# Patient Record
Sex: Female | Born: 1965
Health system: Southern US, Community
[De-identification: ages and names within clinical notes are randomized; demographics above are authoritative.]

---

## 2002-11-22 ENCOUNTER — Encounter: Admission: RE | Admit: 2002-11-22 | Discharge: 2002-11-22 | Payer: Self-pay | Admitting: Family Medicine

## 2002-11-22 ENCOUNTER — Encounter: Payer: Self-pay | Admitting: Family Medicine

## 2003-12-22 ENCOUNTER — Other Ambulatory Visit: Admission: RE | Admit: 2003-12-22 | Discharge: 2003-12-22 | Payer: Self-pay | Admitting: Family Medicine

## 2005-01-05 ENCOUNTER — Other Ambulatory Visit: Admission: RE | Admit: 2005-01-05 | Discharge: 2005-01-05 | Payer: Self-pay | Admitting: Family Medicine

## 2005-03-29 ENCOUNTER — Encounter: Admission: RE | Admit: 2005-03-29 | Discharge: 2005-03-29 | Payer: Self-pay | Admitting: Family Medicine

## 2006-04-06 ENCOUNTER — Other Ambulatory Visit: Admission: RE | Admit: 2006-04-06 | Discharge: 2006-04-06 | Payer: Self-pay | Admitting: Family Medicine

## 2007-10-18 ENCOUNTER — Other Ambulatory Visit: Admission: RE | Admit: 2007-10-18 | Discharge: 2007-10-18 | Payer: Self-pay | Admitting: Family Medicine

## 2008-04-15 ENCOUNTER — Encounter: Admission: RE | Admit: 2008-04-15 | Discharge: 2008-04-15 | Payer: Self-pay | Admitting: Family Medicine

## 2008-10-07 ENCOUNTER — Encounter: Admission: RE | Admit: 2008-10-07 | Discharge: 2008-10-07 | Payer: Self-pay | Admitting: Family Medicine

## 2008-10-20 ENCOUNTER — Other Ambulatory Visit: Admission: RE | Admit: 2008-10-20 | Discharge: 2008-10-20 | Payer: Self-pay | Admitting: Family Medicine

## 2009-10-29 ENCOUNTER — Other Ambulatory Visit: Admission: RE | Admit: 2009-10-29 | Discharge: 2009-10-29 | Payer: Self-pay | Admitting: Family Medicine

## 2010-12-19 ENCOUNTER — Encounter: Payer: Self-pay | Admitting: Family Medicine

## 2010-12-20 ENCOUNTER — Encounter: Payer: Self-pay | Admitting: Family Medicine

## 2011-01-03 ENCOUNTER — Other Ambulatory Visit (HOSPITAL_COMMUNITY)
Admission: RE | Admit: 2011-01-03 | Discharge: 2011-01-03 | Disposition: A | Discharge status: Home / Self Care | Payer: 59 | Source: Ambulatory Visit | Attending: Family Medicine | Admitting: Family Medicine

## 2011-01-03 ENCOUNTER — Other Ambulatory Visit: Payer: Self-pay | Admitting: Family Medicine

## 2011-01-03 DIAGNOSIS — Z124 Encounter for screening for malignant neoplasm of cervix: Secondary | ICD-10-CM | POA: Insufficient documentation

## 2012-02-02 ENCOUNTER — Other Ambulatory Visit: Payer: Self-pay | Admitting: Family Medicine

## 2012-02-02 ENCOUNTER — Other Ambulatory Visit (HOSPITAL_COMMUNITY)
Admission: RE | Admit: 2012-02-02 | Discharge: 2012-02-02 | Disposition: A | Discharge status: Home / Self Care | Payer: 59 | Source: Ambulatory Visit | Attending: Family Medicine | Admitting: Family Medicine

## 2012-02-02 DIAGNOSIS — Z01419 Encounter for gynecological examination (general) (routine) without abnormal findings: Secondary | ICD-10-CM | POA: Insufficient documentation

## 2013-04-08 ENCOUNTER — Other Ambulatory Visit: Payer: Self-pay | Admitting: Family Medicine

## 2013-04-08 DIAGNOSIS — R920 Mammographic microcalcification found on diagnostic imaging of breast: Secondary | ICD-10-CM

## 2013-04-24 ENCOUNTER — Ambulatory Visit
Admission: RE | Admit: 2013-04-24 | Discharge: 2013-04-24 | Disposition: A | Discharge status: Home / Self Care | Payer: 59 | Source: Ambulatory Visit | Attending: Family Medicine | Admitting: Family Medicine

## 2013-04-24 ENCOUNTER — Other Ambulatory Visit: Payer: Self-pay | Admitting: Family Medicine

## 2013-04-24 DIAGNOSIS — R920 Mammographic microcalcification found on diagnostic imaging of breast: Secondary | ICD-10-CM

## 2013-04-30 ENCOUNTER — Other Ambulatory Visit: Payer: Self-pay | Admitting: Family Medicine

## 2013-04-30 ENCOUNTER — Other Ambulatory Visit (HOSPITAL_COMMUNITY): Payer: Self-pay | Admitting: Diagnostic Radiology

## 2013-04-30 ENCOUNTER — Ambulatory Visit
Admission: RE | Admit: 2013-04-30 | Discharge: 2013-04-30 | Disposition: A | Discharge status: Home / Self Care | Payer: 59 | Source: Ambulatory Visit | Attending: Family Medicine | Admitting: Family Medicine

## 2013-04-30 DIAGNOSIS — R920 Mammographic microcalcification found on diagnostic imaging of breast: Secondary | ICD-10-CM

## 2013-05-01 ENCOUNTER — Inpatient Hospital Stay: Admission: RE | Admit: 2013-05-01 | Payer: 59 | Source: Ambulatory Visit

## 2014-02-12 ENCOUNTER — Other Ambulatory Visit (HOSPITAL_COMMUNITY)
Admission: RE | Admit: 2014-02-12 | Discharge: 2014-02-12 | Disposition: A | Discharge status: Home / Self Care | Payer: 59 | Source: Ambulatory Visit | Attending: Family Medicine | Admitting: Family Medicine

## 2014-02-12 ENCOUNTER — Other Ambulatory Visit: Payer: Self-pay | Admitting: Family Medicine

## 2014-02-12 DIAGNOSIS — N6459 Other signs and symptoms in breast: Secondary | ICD-10-CM

## 2014-02-12 DIAGNOSIS — Z124 Encounter for screening for malignant neoplasm of cervix: Secondary | ICD-10-CM | POA: Insufficient documentation

## 2014-02-12 DIAGNOSIS — N6320 Unspecified lump in the left breast, unspecified quadrant: Principal | ICD-10-CM

## 2014-02-12 DIAGNOSIS — N6325 Unspecified lump in the left breast, overlapping quadrants: Secondary | ICD-10-CM

## 2014-02-21 ENCOUNTER — Other Ambulatory Visit: Payer: 59

## 2014-05-23 ENCOUNTER — Ambulatory Visit
Admission: RE | Admit: 2014-05-23 | Discharge: 2014-05-23 | Disposition: A | Discharge status: Home / Self Care | Payer: 59 | Source: Ambulatory Visit | Attending: Family Medicine | Admitting: Family Medicine

## 2014-05-23 ENCOUNTER — Encounter (INDEPENDENT_AMBULATORY_CARE_PROVIDER_SITE_OTHER): Payer: Self-pay

## 2014-05-23 DIAGNOSIS — N6459 Other signs and symptoms in breast: Secondary | ICD-10-CM

## 2016-02-22 ENCOUNTER — Other Ambulatory Visit: Payer: Self-pay | Admitting: Family Medicine

## 2016-02-22 ENCOUNTER — Other Ambulatory Visit (HOSPITAL_COMMUNITY)
Admission: RE | Admit: 2016-02-22 | Discharge: 2016-02-22 | Disposition: A | Discharge status: Home / Self Care | Payer: 59 | Source: Ambulatory Visit | Attending: Family Medicine | Admitting: Family Medicine

## 2016-02-22 DIAGNOSIS — Z124 Encounter for screening for malignant neoplasm of cervix: Secondary | ICD-10-CM | POA: Insufficient documentation

## 2016-02-23 LAB — CYTOLOGY - PAP

## 2016-07-13 ENCOUNTER — Other Ambulatory Visit: Payer: Self-pay | Admitting: Family Medicine

## 2016-07-13 DIAGNOSIS — Z1231 Encounter for screening mammogram for malignant neoplasm of breast: Secondary | ICD-10-CM

## 2016-07-27 ENCOUNTER — Ambulatory Visit
Admission: RE | Admit: 2016-07-27 | Discharge: 2016-07-27 | Disposition: A | Discharge status: Home / Self Care | Payer: 59 | Source: Ambulatory Visit | Attending: Family Medicine | Admitting: Family Medicine

## 2016-07-27 DIAGNOSIS — Z1231 Encounter for screening mammogram for malignant neoplasm of breast: Secondary | ICD-10-CM

## 2019-03-12 DIAGNOSIS — Z0000 Encounter for general adult medical examination without abnormal findings: Secondary | ICD-10-CM | POA: Diagnosis not present

## 2019-03-12 DIAGNOSIS — E039 Hypothyroidism, unspecified: Secondary | ICD-10-CM | POA: Diagnosis not present

## 2019-03-12 DIAGNOSIS — Z13220 Encounter for screening for lipoid disorders: Secondary | ICD-10-CM | POA: Diagnosis not present

## 2019-03-12 DIAGNOSIS — Z8342 Family history of familial hypercholesterolemia: Secondary | ICD-10-CM | POA: Diagnosis not present

## 2019-04-10 ENCOUNTER — Other Ambulatory Visit: Payer: Self-pay | Admitting: Family Medicine

## 2019-04-10 DIAGNOSIS — Z1231 Encounter for screening mammogram for malignant neoplasm of breast: Secondary | ICD-10-CM

## 2019-05-04 ENCOUNTER — Ambulatory Visit: Payer: 59

## 2019-06-04 ENCOUNTER — Ambulatory Visit
Admission: RE | Admit: 2019-06-04 | Discharge: 2019-06-04 | Disposition: A | Discharge status: Home / Self Care | Payer: BC Managed Care – PPO | Source: Ambulatory Visit | Attending: Family Medicine | Admitting: Family Medicine

## 2019-06-04 ENCOUNTER — Other Ambulatory Visit: Payer: Self-pay

## 2019-06-04 DIAGNOSIS — Z1231 Encounter for screening mammogram for malignant neoplasm of breast: Secondary | ICD-10-CM

## 2019-07-19 DIAGNOSIS — H5713 Ocular pain, bilateral: Secondary | ICD-10-CM | POA: Diagnosis not present

## 2019-10-31 ENCOUNTER — Other Ambulatory Visit: Payer: Self-pay

## 2019-10-31 DIAGNOSIS — Z20822 Contact with and (suspected) exposure to covid-19: Secondary | ICD-10-CM

## 2019-11-04 LAB — NOVEL CORONAVIRUS, NAA: SARS-CoV-2, NAA: NOT DETECTED

## 2019-11-25 DIAGNOSIS — H04123 Dry eye syndrome of bilateral lacrimal glands: Secondary | ICD-10-CM | POA: Diagnosis not present

## 2019-12-23 DIAGNOSIS — H04123 Dry eye syndrome of bilateral lacrimal glands: Secondary | ICD-10-CM | POA: Diagnosis not present

## 2020-03-10 DIAGNOSIS — H04123 Dry eye syndrome of bilateral lacrimal glands: Secondary | ICD-10-CM | POA: Diagnosis not present

## 2020-04-14 DIAGNOSIS — E039 Hypothyroidism, unspecified: Secondary | ICD-10-CM | POA: Diagnosis not present

## 2020-04-14 DIAGNOSIS — Z0000 Encounter for general adult medical examination without abnormal findings: Secondary | ICD-10-CM | POA: Diagnosis not present

## 2020-04-14 DIAGNOSIS — Z13220 Encounter for screening for lipoid disorders: Secondary | ICD-10-CM | POA: Diagnosis not present

## 2020-04-16 ENCOUNTER — Other Ambulatory Visit (HOSPITAL_COMMUNITY): Payer: Self-pay | Admitting: Family Medicine

## 2020-04-16 DIAGNOSIS — R0989 Other specified symptoms and signs involving the circulatory and respiratory systems: Secondary | ICD-10-CM

## 2020-04-24 ENCOUNTER — Ambulatory Visit (HOSPITAL_COMMUNITY)
Admission: RE | Admit: 2020-04-24 | Discharge: 2020-04-24 | Disposition: A | Discharge status: Home / Self Care | Payer: BC Managed Care – PPO | Source: Ambulatory Visit | Attending: Family Medicine | Admitting: Family Medicine

## 2020-04-24 ENCOUNTER — Other Ambulatory Visit: Payer: Self-pay

## 2020-04-24 DIAGNOSIS — R0989 Other specified symptoms and signs involving the circulatory and respiratory systems: Secondary | ICD-10-CM

## 2020-06-24 DIAGNOSIS — H04123 Dry eye syndrome of bilateral lacrimal glands: Secondary | ICD-10-CM | POA: Diagnosis not present

## 2020-06-24 DIAGNOSIS — H0288A Meibomian gland dysfunction right eye, upper and lower eyelids: Secondary | ICD-10-CM | POA: Diagnosis not present

## 2020-06-24 DIAGNOSIS — H0288B Meibomian gland dysfunction left eye, upper and lower eyelids: Secondary | ICD-10-CM | POA: Diagnosis not present

## 2020-07-23 DIAGNOSIS — H04123 Dry eye syndrome of bilateral lacrimal glands: Secondary | ICD-10-CM | POA: Diagnosis not present

## 2020-08-26 DIAGNOSIS — H40043 Steroid responder, bilateral: Secondary | ICD-10-CM | POA: Diagnosis not present

## 2020-08-26 DIAGNOSIS — H02881 Meibomian gland dysfunction right upper eyelid: Secondary | ICD-10-CM | POA: Diagnosis not present

## 2020-08-26 DIAGNOSIS — H02882 Meibomian gland dysfunction right lower eyelid: Secondary | ICD-10-CM | POA: Diagnosis not present

## 2020-08-26 DIAGNOSIS — L718 Other rosacea: Secondary | ICD-10-CM | POA: Diagnosis not present

## 2020-09-02 DIAGNOSIS — L719 Rosacea, unspecified: Secondary | ICD-10-CM | POA: Diagnosis not present

## 2020-09-02 DIAGNOSIS — H02883 Meibomian gland dysfunction of right eye, unspecified eyelid: Secondary | ICD-10-CM | POA: Diagnosis not present

## 2020-09-02 DIAGNOSIS — H02886 Meibomian gland dysfunction of left eye, unspecified eyelid: Secondary | ICD-10-CM | POA: Diagnosis not present

## 2020-09-02 DIAGNOSIS — H40043 Steroid responder, bilateral: Secondary | ICD-10-CM | POA: Diagnosis not present

## 2020-10-15 DIAGNOSIS — H40043 Steroid responder, bilateral: Secondary | ICD-10-CM | POA: Diagnosis not present

## 2020-10-15 DIAGNOSIS — H02886 Meibomian gland dysfunction of left eye, unspecified eyelid: Secondary | ICD-10-CM | POA: Diagnosis not present

## 2020-10-15 DIAGNOSIS — H02883 Meibomian gland dysfunction of right eye, unspecified eyelid: Secondary | ICD-10-CM | POA: Diagnosis not present

## 2020-10-15 DIAGNOSIS — L719 Rosacea, unspecified: Secondary | ICD-10-CM | POA: Diagnosis not present

## 2021-02-03 DIAGNOSIS — L718 Other rosacea: Secondary | ICD-10-CM | POA: Diagnosis not present

## 2021-02-03 DIAGNOSIS — H0288A Meibomian gland dysfunction right eye, upper and lower eyelids: Secondary | ICD-10-CM | POA: Diagnosis not present

## 2021-02-03 DIAGNOSIS — H0288B Meibomian gland dysfunction left eye, upper and lower eyelids: Secondary | ICD-10-CM | POA: Diagnosis not present

## 2021-02-03 DIAGNOSIS — H40043 Steroid responder, bilateral: Secondary | ICD-10-CM | POA: Diagnosis not present

## 2021-03-18 DIAGNOSIS — H6121 Impacted cerumen, right ear: Secondary | ICD-10-CM | POA: Diagnosis not present

## 2021-03-18 DIAGNOSIS — H93293 Other abnormal auditory perceptions, bilateral: Secondary | ICD-10-CM | POA: Diagnosis not present

## 2021-03-18 DIAGNOSIS — J343 Hypertrophy of nasal turbinates: Secondary | ICD-10-CM | POA: Diagnosis not present

## 2021-03-22 DIAGNOSIS — H93293 Other abnormal auditory perceptions, bilateral: Secondary | ICD-10-CM | POA: Diagnosis not present

## 2021-04-27 IMAGING — MG DIGITAL SCREENING BILATERAL MAMMOGRAM WITH TOMO AND CAD
8 series · 8 of 24 positions shown · non-contrast
Comparison: Previous exam(s).

CLINICAL DATA: Screening.

EXAM:
DIGITAL SCREENING BILATERAL MAMMOGRAM WITH TOMO AND CAD

[R CC synth-2D]
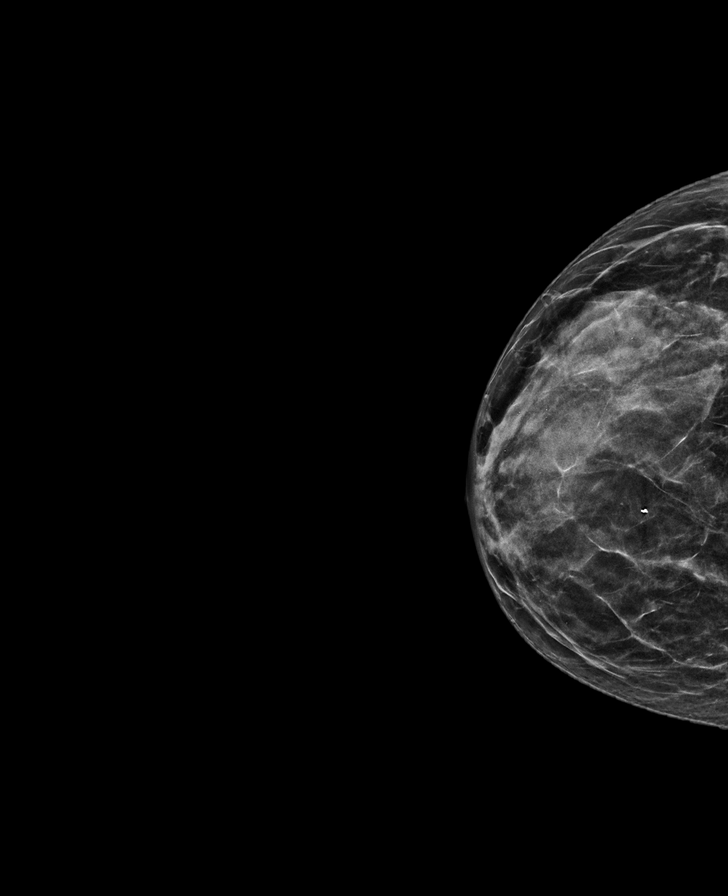

[L CC synth-2D]
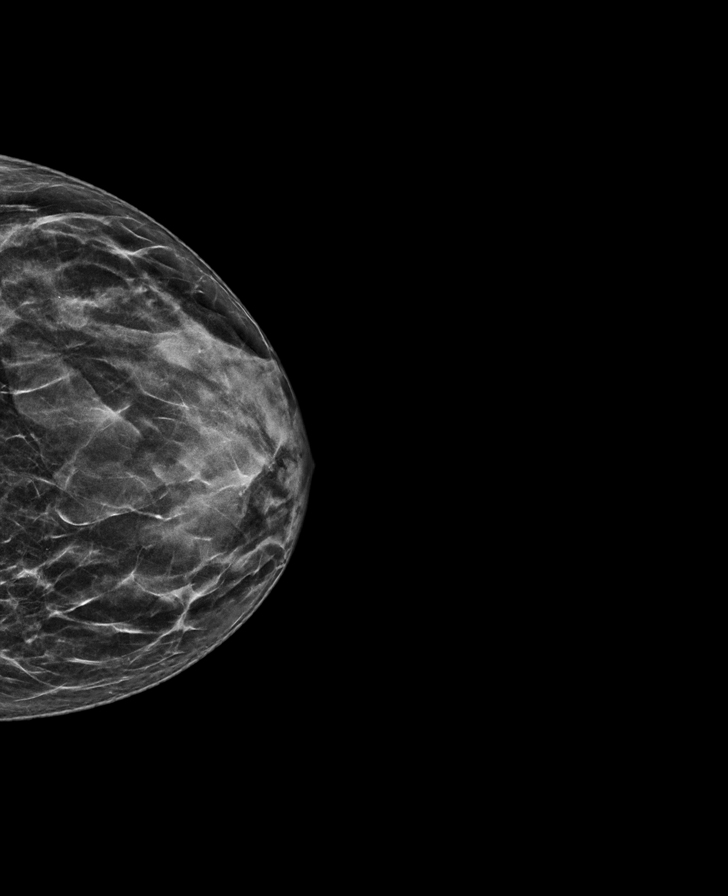

[L MLO synth-2D]
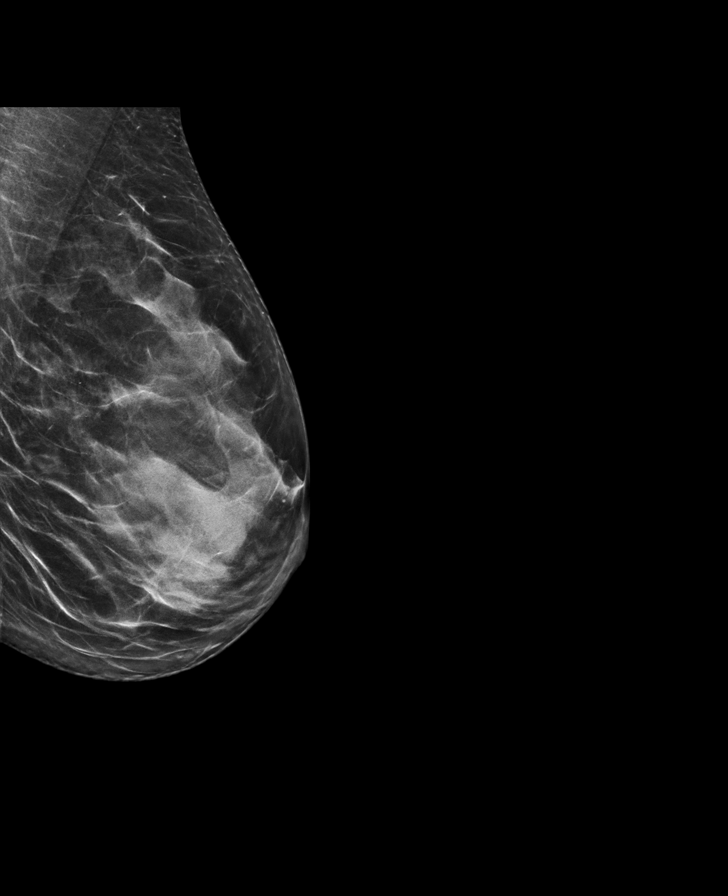

[R MLO synth-2D]
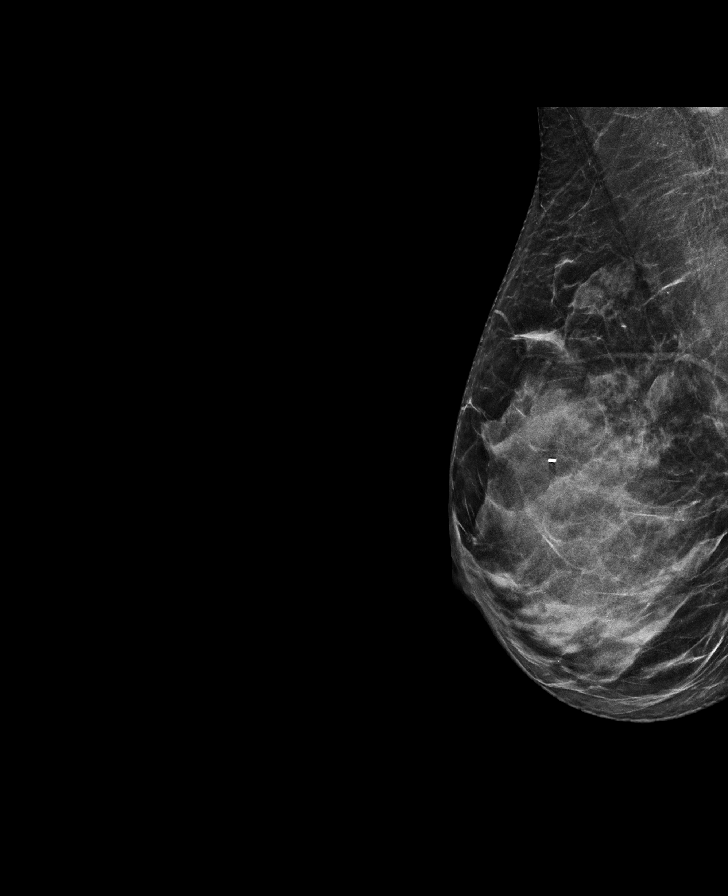

[L CC tomo · tomo slice 29/57.0]
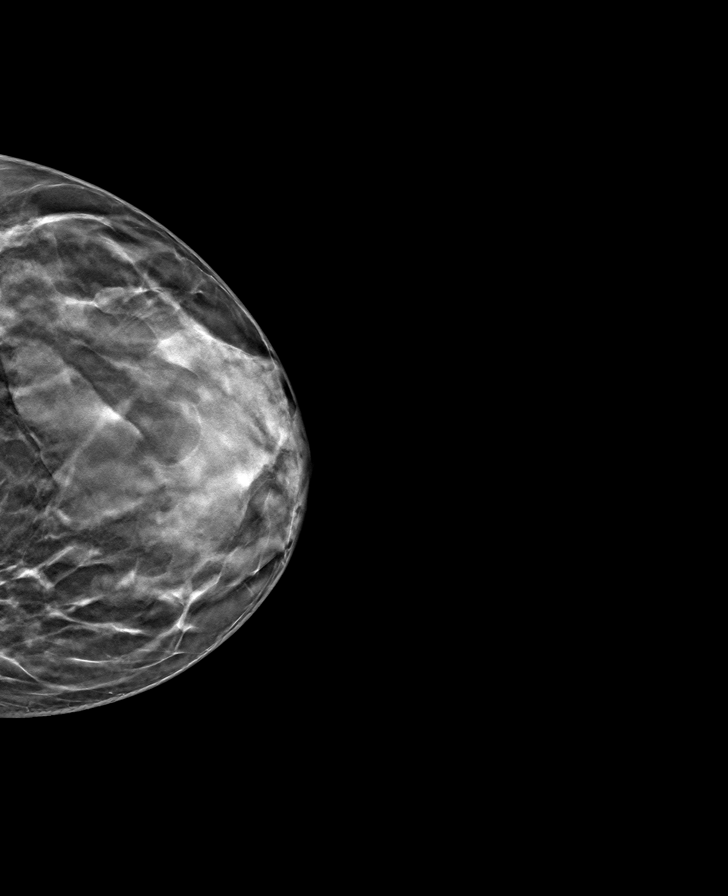

[R CC tomo · tomo slice 27/52.0]
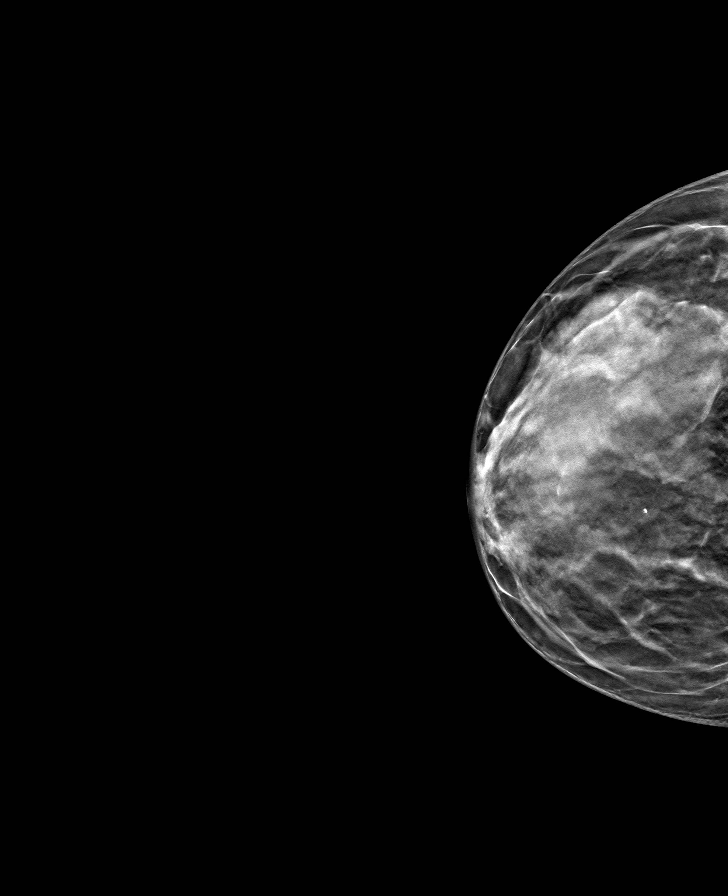

[L MLO tomo · tomo slice 33/65.0]
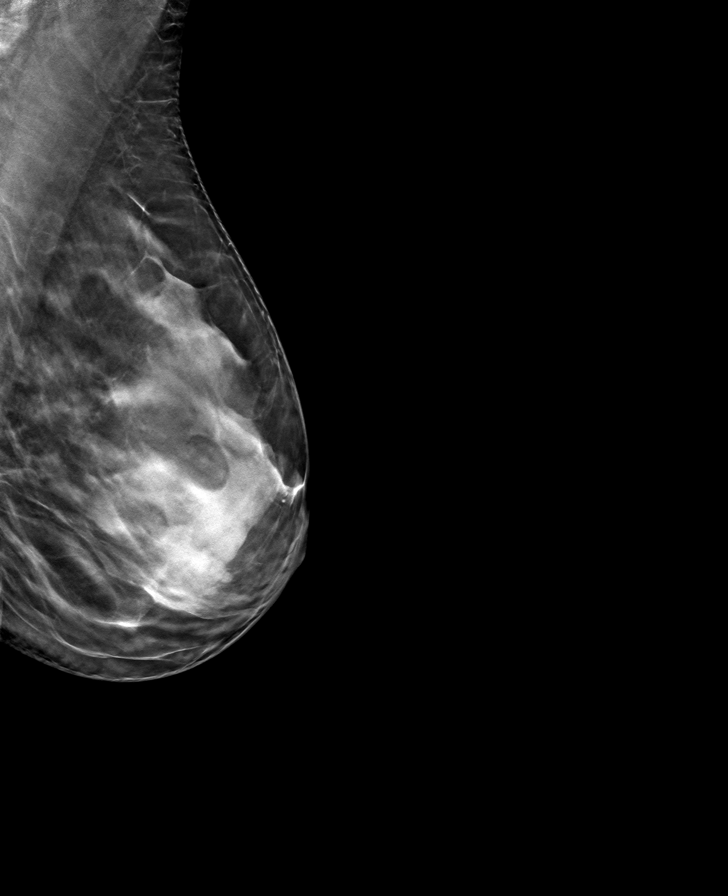

[R MLO tomo · tomo slice 31/61.0]
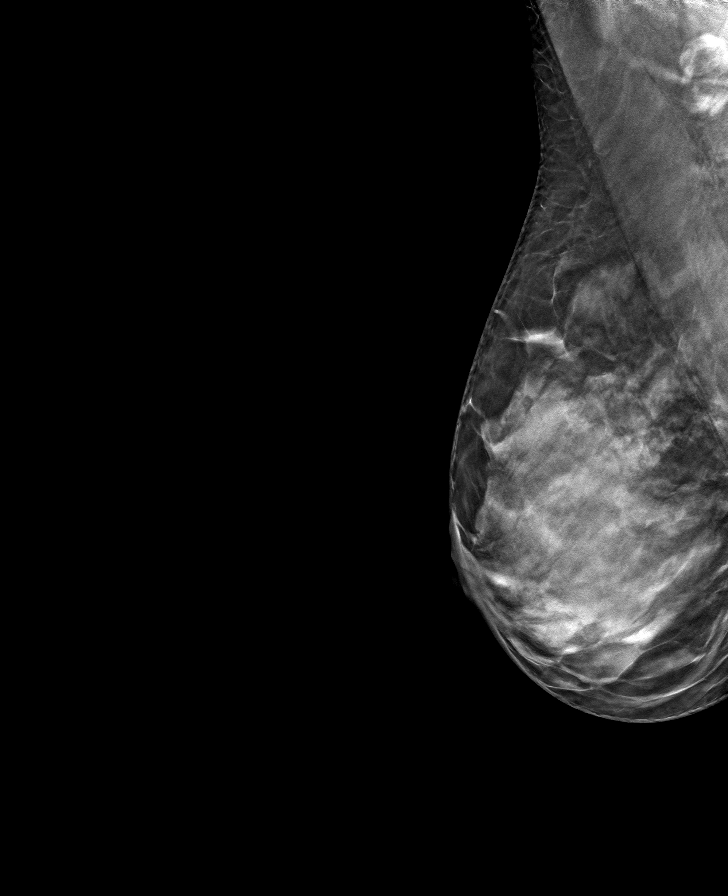

[8 of 24 positions shown; findings below may reference images not displayed]

ACR Breast Density Category d: The breast tissue is extremely dense,
which lowers the sensitivity of mammography
FINDINGS: There are no findings suspicious for malignancy. Images were
processed with CAD.
IMPRESSION: No mammographic evidence of malignancy. A result letter of this
screening mammogram will be mailed directly to the patient.

RECOMMENDATION:
Screening mammogram in one year. (Code:WO-0-ZI0)

BI-RADS CATEGORY  1: Negative.

## 2021-04-29 DIAGNOSIS — Z13220 Encounter for screening for lipoid disorders: Secondary | ICD-10-CM | POA: Diagnosis not present

## 2021-04-29 DIAGNOSIS — M9901 Segmental and somatic dysfunction of cervical region: Secondary | ICD-10-CM | POA: Diagnosis not present

## 2021-04-29 DIAGNOSIS — M5411 Radiculopathy, occipito-atlanto-axial region: Secondary | ICD-10-CM | POA: Diagnosis not present

## 2021-04-29 DIAGNOSIS — Z0000 Encounter for general adult medical examination without abnormal findings: Secondary | ICD-10-CM | POA: Diagnosis not present

## 2021-05-11 DIAGNOSIS — N951 Menopausal and female climacteric states: Secondary | ICD-10-CM | POA: Diagnosis not present

## 2021-05-18 DIAGNOSIS — L718 Other rosacea: Secondary | ICD-10-CM | POA: Diagnosis not present

## 2021-05-18 DIAGNOSIS — N951 Menopausal and female climacteric states: Secondary | ICD-10-CM | POA: Diagnosis not present

## 2021-05-18 DIAGNOSIS — H0102B Squamous blepharitis left eye, upper and lower eyelids: Secondary | ICD-10-CM | POA: Diagnosis not present

## 2021-05-18 DIAGNOSIS — N898 Other specified noninflammatory disorders of vagina: Secondary | ICD-10-CM | POA: Diagnosis not present

## 2021-05-18 DIAGNOSIS — R232 Flushing: Secondary | ICD-10-CM | POA: Diagnosis not present

## 2021-05-18 DIAGNOSIS — H02055 Trichiasis without entropian left lower eyelid: Secondary | ICD-10-CM | POA: Diagnosis not present

## 2021-05-18 DIAGNOSIS — H0102A Squamous blepharitis right eye, upper and lower eyelids: Secondary | ICD-10-CM | POA: Diagnosis not present

## 2021-05-18 DIAGNOSIS — H02052 Trichiasis without entropian right lower eyelid: Secondary | ICD-10-CM | POA: Diagnosis not present

## 2024-05-24 ENCOUNTER — Other Ambulatory Visit: Payer: Self-pay | Admitting: Podiatry

## 2024-05-24 ENCOUNTER — Ambulatory Visit (INDEPENDENT_AMBULATORY_CARE_PROVIDER_SITE_OTHER): Admitting: Podiatry

## 2024-05-24 DIAGNOSIS — M79671 Pain in right foot: Secondary | ICD-10-CM

## 2024-05-24 DIAGNOSIS — B351 Tinea unguium: Secondary | ICD-10-CM | POA: Diagnosis not present

## 2024-05-24 DIAGNOSIS — M79672 Pain in left foot: Secondary | ICD-10-CM | POA: Diagnosis not present

## 2024-05-24 DIAGNOSIS — L309 Dermatitis, unspecified: Secondary | ICD-10-CM | POA: Diagnosis not present

## 2024-05-24 MED ORDER — TRIAMCINOLONE ACETONIDE 0.5 % EX OINT: 1.0000 | TOPICAL_OINTMENT | Freq: Two times a day (BID) | CUTANEOUS | 1 refills | Status: AC

## 2024-05-24 NOTE — Progress Notes (Signed)
 Patient presents with complaint of recurrence onychomycosis.  Mostly on the great right toe.  She tried some topical Jublia but this did not get rid of it.  Also complains of a rash on the anterior anterior part of the ankle left.  Send bothering her for about a month or 2.  Itches and gets red and flaky   Physical exam:  General appearance: Pleasant, and in no acute distress. AOx3.  Vascular: Pedal pulses: DP 2/4 bilaterally, PT 2/4 bilaterally.  No edema lower legs bilaterally. Capillary fill time immediate.  Neurological: Light touch intact feet bilaterally.  Normal Achilles reflex bilaterally.  No clonus or spasticity noted.   Dermatologic:   Skin normal temperature bilaterally.  Dry scaly erythematous rash anterior ankle left with reddish redness dermal edema.  Musculoskeletal: Normal muscle strength around foot and ankle.  Good range of motion ankle joint and subtalar joint.  Diagnosis: 1 pain pain feet bilaterally.   2 onychomycosis 1 through 5 bilaterally 3 eczema anterior ankle left  Plan: -New patient office visit level 3 for evaluation and management. - Discussed with her the recurrence of the onychomycosis recommended a second round of Lamisil.  Again discussed risk and benefits of the treatment.  Discussed possible liver toxicity.  Patient would like to proceed with Lamisil treatment.  Discussed with her the eczema and etiology and treatment will start her on triamcinolone cream to try to eliminate the rash and the itching. -Rx Lamisil 250 mg 1 p.o. daily 30 days - Rx triamcinolone cream 0.5%, apply to affected area feet twice daily, 60 g, refill x 2 -Blood drawn labs ordered for LFTs  Return 4 weeks Lamisil 2 and follow-up eczema

## 2024-05-25 LAB — HEPATIC FUNCTION PANEL
ALT: 15 IU/L (ref 0–32)
AST: 20 IU/L (ref 0–40)
Albumin: 4.8 g/dL (ref 3.8–4.9)
Alkaline Phosphatase: 54 IU/L (ref 44–121)
Bilirubin Total: 0.4 mg/dL (ref 0.0–1.2)
Bilirubin, Direct: 0.14 mg/dL (ref 0.00–0.40)
Total Protein: 7.3 g/dL (ref 6.0–8.5)

## 2024-05-28 ENCOUNTER — Encounter: Payer: Self-pay | Admitting: Podiatry

## 2024-05-28 MED ORDER — TERBINAFINE HCL 250 MG PO TABS: 250.0000 mg | ORAL_TABLET | Freq: Every day | ORAL | 0 refills | Status: AC

## 2024-06-21 ENCOUNTER — Ambulatory Visit: Admitting: Podiatry

## 2024-06-30 ENCOUNTER — Other Ambulatory Visit: Payer: Self-pay | Admitting: Podiatry

## 2024-07-05 ENCOUNTER — Other Ambulatory Visit: Payer: Self-pay | Admitting: Podiatry

## 2024-07-05 ENCOUNTER — Ambulatory Visit: Admitting: Podiatry

## 2024-07-05 ENCOUNTER — Encounter: Payer: Self-pay | Admitting: Podiatry

## 2024-07-05 DIAGNOSIS — M79671 Pain in right foot: Secondary | ICD-10-CM | POA: Diagnosis not present

## 2024-07-05 DIAGNOSIS — M79672 Pain in left foot: Secondary | ICD-10-CM | POA: Diagnosis not present

## 2024-07-05 DIAGNOSIS — B351 Tinea unguium: Secondary | ICD-10-CM | POA: Diagnosis not present

## 2024-07-05 MED ORDER — TERBINAFINE HCL 250 MG PO TABS: 250.0000 mg | ORAL_TABLET | Freq: Every day | ORAL | 0 refills | Status: AC

## 2024-07-05 NOTE — Addendum Note (Signed)
 Addended by: GERRIT ANDREZ CROME on: 07/05/2024 08:06 AM   Modules accepted: Orders

## 2024-07-05 NOTE — Progress Notes (Signed)
   Subjective:    HPI Presents for follow-up onychomycosis treatment with p.o. Lamisil .  No problems taking medicine with no side effects noted.  Tenderness around toes with walking and wearing shoes.   Objective:  Physical Exam   General: AAO x3, NAD  Vascular: DP and PT pulses palpable bilaterally.  Immedate capillary fill time digits. No significant lower extremity edema bilaterally.  Dermatological: Onychomycotic mycotic changes nails 1 through 5 with discoloration nail and subungual debris and thickening of the nail. 0% Clearance of onychomycotic nail changes noted. Tenderness with walking and wearing shoes.  Neruologic: Grossly intact B/L  Musculoskeletal:   Assessment:  Painful onychomycotic nails 1 through 5 bilaterally. Pain feet b/l     Plan:  -Established office visit level 3 for evaluation and management -Patient is tolerating Lamisil  treatment for onychomycotic nails well.  No side effects noted. Will continue this treatment. -Rx: Lamisil  250 mg p.o. daily - Labs ordered today for liver function tests to monitor for any hepatic side effects from the Lamisil .  - Return in 4 weeks for  Lamisil  2

## 2024-07-06 LAB — HEPATIC FUNCTION PANEL
ALT: 23 IU/L (ref 0–32)
AST: 27 IU/L (ref 0–40)
Albumin: 4.8 g/dL (ref 3.8–4.9)
Alkaline Phosphatase: 48 IU/L (ref 44–121)
Bilirubin Total: 0.5 mg/dL (ref 0.0–1.2)
Bilirubin, Direct: 0.15 mg/dL (ref 0.00–0.40)
Total Protein: 6.9 g/dL (ref 6.0–8.5)

## 2024-08-02 ENCOUNTER — Other Ambulatory Visit: Payer: Self-pay | Admitting: Podiatry

## 2024-08-05 ENCOUNTER — Ambulatory Visit: Admitting: Podiatry

## 2024-08-05 DIAGNOSIS — B351 Tinea unguium: Secondary | ICD-10-CM

## 2024-08-05 DIAGNOSIS — M79672 Pain in left foot: Secondary | ICD-10-CM

## 2024-08-05 DIAGNOSIS — M79671 Pain in right foot: Secondary | ICD-10-CM

## 2024-08-05 MED ORDER — TERBINAFINE HCL 250 MG PO TABS: 250.0000 mg | ORAL_TABLET | Freq: Every day | ORAL | 0 refills | Status: AC

## 2024-08-05 NOTE — Progress Notes (Signed)
   Subjective:    HPI Presents for follow-up onychomycosis treatment with p.o. Lamisil .  No problems taking medicine with no side effects noted.  Tenderness around toes with walking and wearing shoes.   Objective:  Physical Exam   General: AAO x3, NAD  Vascular: DP and PT pulses palpable bilaterally.  Immedate capillary fill time digits. No significant lower extremity edema bilaterally.  Dermatological: Onychomycotic mycotic changes nails 1 through 5 with discoloration nail and subungual debris and thickening of the nail. 10% Clearance of onychomycotic nail changes noted. Tenderness with walking and wearing shoes.  Neruologic: Grossly intact B/L  Musculoskeletal:   Assessment:  Painful onychomycotic nails 1 through 5 bilaterally. Pain feet b/l     Plan:  -Established office visit level 3 for evaluation and management -Patient is tolerating Lamisil  treatment for onychomycotic nails well.  No side effects noted. Will continue this treatment. -Rx: Lamisil  250 mg p.o. daily - Labs ordered today for liver function tests to monitor for any hepatic side effects from the Lamisil .  - Return in 4 weeks for  Lamisil  final

## 2024-08-06 LAB — HEPATIC FUNCTION PANEL
ALT: 11 IU/L (ref 0–32)
AST: 18 IU/L (ref 0–40)
Albumin: 4.5 g/dL (ref 3.8–4.9)
Alkaline Phosphatase: 43 IU/L — ABNORMAL LOW (ref 44–121)
Bilirubin Total: 0.5 mg/dL (ref 0.0–1.2)
Bilirubin, Direct: 0.14 mg/dL (ref 0.00–0.40)
Total Protein: 6.8 g/dL (ref 6.0–8.5)

## 2024-09-03 ENCOUNTER — Ambulatory Visit: Admitting: Podiatry

## 2024-09-04 ENCOUNTER — Ambulatory Visit (INDEPENDENT_AMBULATORY_CARE_PROVIDER_SITE_OTHER): Admitting: Podiatry

## 2024-09-04 DIAGNOSIS — B351 Tinea unguium: Secondary | ICD-10-CM | POA: Diagnosis not present

## 2024-09-04 DIAGNOSIS — M79672 Pain in left foot: Secondary | ICD-10-CM | POA: Diagnosis not present

## 2024-09-04 DIAGNOSIS — M79671 Pain in right foot: Secondary | ICD-10-CM

## 2024-09-04 NOTE — Progress Notes (Signed)
   Subjective:    HPI Presents for follow-up onychomycosis treatment with p.o. Lamisil .  No problems taking medicine with no side effects noted.  Tenderness around toes with walking and wearing shoes.   Objective:  Physical Exam   General: AAO x3, NAD  Vascular: DP and PT pulses palpable bilaterally.  Immedate capillary fill time digits. No significant lower extremity edema bilaterally.  Dermatological: Onychomycotic mycotic changes nails 1 through 5 with discoloration nail and subungual debris and thickening of the nail. 10% Clearance of onychomycotic nail changes noted. Tenderness with walking and wearing shoes.  Neruologic: Grossly intact B/L  Musculoskeletal:   Assessment:  Painful onychomycotic nails 1 through 5 bilaterally. Pain feet b/l     Plan:  -Established office visit level 3 for evaluation and management -Patient is tolerating Lamisil  treatment for onychomycotic nails well.  No side effects noted. . - Labs ordered today for liver function tests to monitor for any hepatic side effects from the Lamisil .  - As needed.  If nails are not cleared by about March call us  and we will take a look at them.
